# Patient Record
Sex: Male | Born: 1985 | Race: White | Hispanic: No | Marital: Single | Smoking: Never smoker
Health system: Southern US, Community
[De-identification: ages and names within clinical notes are randomized; demographics above are authoritative.]

---
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
## 2007-08-19 ENCOUNTER — Emergency Department: Payer: Self-pay | Admitting: Emergency Medicine

## 2008-09-03 ENCOUNTER — Emergency Department: Payer: Self-pay | Admitting: Emergency Medicine

## 2016-05-07 ENCOUNTER — Emergency Department
Admission: EM | Admit: 2016-05-07 | Discharge: 2016-05-07 | Disposition: A | Discharge status: Home / Self Care | Payer: Self-pay | Attending: Emergency Medicine | Admitting: Emergency Medicine

## 2016-05-07 DIAGNOSIS — K122 Cellulitis and abscess of mouth: Secondary | ICD-10-CM | POA: Insufficient documentation

## 2016-05-07 MED ORDER — IBUPROFEN 800 MG PO TABS
800.0000 mg | ORAL_TABLET | Freq: Once | ORAL | Status: AC
  Administered 2016-05-07: 800 mg via ORAL
  Filled 2016-05-07: qty 1

## 2016-05-07 MED ORDER — DEXAMETHASONE SODIUM PHOSPHATE 10 MG/ML IJ SOLN
10.0000 mg | Freq: Once | INTRAMUSCULAR | Status: AC
  Administered 2016-05-07: 10 mg via INTRAMUSCULAR
  Filled 2016-05-07: qty 1

## 2016-05-07 NOTE — Discharge Instructions (Signed)
Uvulitis °Uvulitis is infection or inflammation of the uvula. The uvula is the small, finger-like piece of tissue that hangs down at the back of your throat. °CAUSES °This condition may be caused by: °· An infection in the mouth or throat. This is the most common cause. °· Trauma to the uvula. Causes of trauma include burning your mouth and heavy snoring. °· Fluid build-up (edema). Edema can be triggered be an allergic reaction. Uvulitis that is caused by edema is called Quincke disease. °· Inhaling irritants, such as chemical agents, smoke, or steam. °SYMPTOMS °Symptoms of this condition depend on the cause.  °Symptoms of uvulitis that is caused by infection include: °· Red, swollen uvula. °· Sore throat. °· Fever. °· Headache. °· Swollen neck glands. °Symptoms of uvulitis that is caused by trauma, edema, or irritation include: °· Red, swollen uvula. °· Sore throat. °· Trouble swallowing. °· Choking or gagging. °· Trouble breathing. °DIAGNOSIS °This condition is diagnosed with a physical exam. You also may have tests, such as a throat culture and blood tests. °TREATMENT °Treatment for this condition depends on the cause. Treatment may involve: °· Antibiotic medicine. Antibiotics may be prescribed if a bacterial infection is the cause. °· Steroid medicine. Steroids may be given if edema is the cause. °· Surgery to remove part of the uvula (partial uvulectomy). °HOME CARE INSTRUCTIONS °· Rest as much as possible until your condition improves. °· Drink enough fluid to keep your urine clear or pale yellow. °· Take over-the-counter and prescription medicines only as told by your health care provider. °· If you were prescribed an antibiotic medicine, take it as told by your health care provider. Do not stop taking the antibiotic even if you start to feel better. °· Use a cool-mist humidifier to ease irritation in your throat. °· While your throat is sore: °¨ Eat soft foods or drink liquids, such as soup. °¨ Gargle with a  salt-water mixture 3-4 times per day or as needed. To make a salt-water mixture, completely dissolve ½-1 tsp of salt in 1 cup of warm water. °· Keep all follow-up visits as told by your health care provider. This is important. °SEEK MEDICAL CARE IF: °· You have a fever. °· You have trouble eating. °· Your symptoms do not get better. °· Your symptoms come back after treatment. °SEEK IMMEDIATE MEDICAL CARE IF: °· You have trouble breathing. °· You have trouble swallowing. °  °This information is not intended to replace advice given to you by your health care provider. Make sure you discuss any questions you have with your health care provider. °  °Document Released: 07/08/2004 Document Revised: 08/19/2015 Document Reviewed: 02/18/2015 °Elsevier Interactive Patient Education ©2016 [employer] ° °

## 2016-05-07 NOTE — ED Provider Notes (Signed)
[facility] Emergency Department Provider Note   ____________________________________________  Time seen: Approximately 6:14 AM  I have reviewed the triage vital signs and the nursing notes.   HISTORY  Chief Complaint Sore Throat    HPI Cole Weaver is a 30 y.o. male who comes into the hospital with some sore throat. The patient reports that he was sleeping and he woke up this morning. He felt as though his throat was dry and there was something in his throat. He felt that he needed to drink some water. He attempted to drink some water and spit up what was in his throat. He reports that he was unable to do so. When the patient went to look in his throat he noticed some swelling in the back of what he called his tonsils. The patient was concerned so he decided to come in for evaluation. The patient has no headache no chest pain no shortness of breath no fevers. The patient has not had any vomiting at home but did feel nauseous. The patient reports that he was drinking alcohol last night. He is here for evaluation.   No past medical history   There are no active problems to display for this patient.   No past surgical history  No current outpatient prescriptions  Allergies Review of patient's allergies indicates no known allergies.  No family history on file.  Social History Social History  Substance Use Topics  . Smoking status: None   . Smokeless tobacco: Not on file  . Alcohol Use: Drinks alcohol     Review of Systems Constitutional: No fever/chills Eyes: No visual changes. ENT:  sore throat. Cardiovascular: Denies chest pain. Respiratory: Denies shortness of breath. Gastrointestinal: Nausea and vomiting with No abdominal pain. No diarrhea.  No constipation. Genitourinary: Negative for dysuria. Musculoskeletal: Negative for back pain. Skin: Negative for rash. Neurological: Negative for headaches, focal weakness or  numbness.  10-point ROS otherwise negative.  ____________________________________________   PHYSICAL EXAM:  VITAL SIGNS: ED Triage Vitals  Enc Vitals Group     BP 05/07/16 0541 133/81 mmHg     Pulse Rate 05/07/16 0539 100     Resp 05/07/16 0539 20     Temp 05/07/16 0539 98.2 F (36.8 C)     Temp Source 05/07/16 0539 Oral     SpO2 05/07/16 0539 99 %     Weight 05/07/16 0538 289 lb (131.09 kg)     Height 05/07/16 0538  (1.854 m)     Head Cir --      Peak Flow --      Pain Score 05/07/16 0538 0     Pain Loc --      Pain Edu? --      Excl. in GC? --     Constitutional: Alert and oriented. Well appearing and in mild distress. Eyes: Conjunctivae are normal. PERRL. EOMI. Head: Atraumatic. Nose: No congestion/rhinnorhea. Mouth/Throat: Mucous membranes are moist.  Uvular edema with no unilateral peritonsillar or tonsillar swelling, minimal redness to the posterior oropharynx. ardiovascular: Normal rate, regular rhythm. Grossly normal heart sounds.  Good peripheral circulation. Respiratory: Normal respiratory effort.  No retractions. Lungs CTAB. Gastrointestinal: Soft and nontender. No distention.  Musculoskeletal: No lower extremity tenderness nor edema.   Neurologic:  Normal speech and language.  Skin:  Skin is warm, dry and intact.  Psychiatric: Mood and affect are normal. .  ____________________________________________   LABS (all labs ordered are listed, but only abnormal results are displayed)  Labs Reviewed  CULTURE, GROUP A STREP [facility])   ____________________________________________  EKG  None ____________________________________________  RADIOLOGY  None ____________________________________________   PROCEDURES  Procedure(s) performed: None  Critical Care performed: No  ____________________________________________   INITIAL IMPRESSION / ASSESSMENT AND PLAN / ED COURSE  Pertinent labs & imaging results that were available during my care of  the patient were reviewed by me and considered in my medical decision making (see chart for details).  This is a 30 year old male who comes into the hospital with some sore throat and throat discomfort. When I did evaluate the patient appears as though he has uvulitis with some uvular edema. The patient reports that this came after he sleep and woke back up. I did question the patient is snoring as a do no some of that trauma can cause some uvulitis symptoms. The patient is had no fevers or runny nose and he has no cervical lymphadenopathy. I will give the patient dose of Decadron and some ibuprofen. The patient is breathing without difficulty and his airways open. He'll be discharged home. As I feel this is not infectious I will not treat the patient with any antibiotics but he does have a throat culture is pending at this time and we will follow it and order antibiotics if needed. The patient be discharged to home. ____________________________________________   FINAL CLINICAL IMPRESSION(S) / ED DIAGNOSES  Final diagnoses:  Uvulitis      NEW MEDICATIONS STARTED DURING THIS VISIT:  New Prescriptions   No medications on file     Note:  This document was prepared using Dragon voice recognition software and may include unintentional dictation errors.    Rebecka ApleyAllison P Giavonni Cizek, MD 05/07/16 [phone]

## 2016-05-07 NOTE — ED Notes (Signed)

## 2016-05-07 NOTE — ED Notes (Addendum)
Patient reports that he woke and states that his tonsils are swollen.  Patient states feels like he can't swallow.  Patient is able to control his own secretions, and speaking in complete sentences.  Patients throat red, with uvula swollen, but airway is patent.

## 2016-05-09 LAB — CULTURE, GROUP A STREP (THRC)

## 2018-12-19 ENCOUNTER — Emergency Department
Admission: EM | Admit: 2018-12-19 | Discharge: 2018-12-19 | Disposition: A | Discharge status: Home / Self Care | Payer: BLUE CROSS/BLUE SHIELD | Attending: Emergency Medicine | Admitting: Emergency Medicine

## 2018-12-19 ENCOUNTER — Encounter: Payer: Self-pay | Admitting: Emergency Medicine

## 2018-12-19 ENCOUNTER — Other Ambulatory Visit: Payer: Self-pay

## 2018-12-19 ENCOUNTER — Emergency Department: Payer: BLUE CROSS/BLUE SHIELD

## 2018-12-19 DIAGNOSIS — M5412 Radiculopathy, cervical region: Secondary | ICD-10-CM | POA: Insufficient documentation

## 2018-12-19 LAB — CBC
HEMATOCRIT: 45.6 % (ref 39.0–52.0)
Hemoglobin: 15.4 g/dL (ref 13.0–17.0)
MCH: 29.1 pg (ref 26.0–34.0)
MCHC: 33.8 g/dL (ref 30.0–36.0)
MCV: 86.2 fL (ref 80.0–100.0)
PLATELETS: 232 10*3/uL (ref 150–400)
RBC: 5.29 MIL/uL (ref 4.22–5.81)
RDW: 12.5 % (ref 11.5–15.5)
WBC: 9 10*3/uL (ref 4.0–10.5)
nRBC: 0 % (ref 0.0–0.2)

## 2018-12-19 LAB — BASIC METABOLIC PANEL
Anion gap: 8 (ref 5–15)
BUN: 10 mg/dL (ref 6–20)
CHLORIDE: 105 mmol/L (ref 98–111)
CO2: 24 mmol/L (ref 22–32)
CREATININE: 1.07 mg/dL (ref 0.61–1.24)
Calcium: 8.9 mg/dL (ref 8.9–10.3)
GFR calc non Af Amer: >60 mL/min (ref >60)
GLUCOSE: 110 mg/dL — AB (ref 70–99)
Potassium: 3.9 mmol/L (ref 3.5–5.1)
Sodium: 137 mmol/L (ref 135–145)

## 2018-12-19 LAB — TROPONIN I: Troponin I: <0.03 ng/mL (ref <0.03)

## 2018-12-19 NOTE — Discharge Instructions (Signed)
Fortunately today your head CT and your CT of your neck are very reassuring.  Please follow up with a PMD within 1 week for a recheck and return to the ED sooner for any concerns.  It was a pleasure to take care of you today, and thank you for coming to our emergency department.  If you have any questions or concerns before leaving please ask the nurse to grab me and I'm more than happy to go through your aftercare instructions again.  If you have any concerns once you are home that you are not improving or are in fact getting worse before you can make it to your follow-up appointment, please do not hesitate to call [phone] and come back for further evaluation.  Merrily BrittleNeil Nilsa Macht, MD  Results for orders placed or performed during the hospital encounter of 12/19/18  Basic metabolic panel  Result Value Ref Range   Sodium 137 135 - 145 mmol/L   Potassium 3.9 3.5 - 5.1 mmol/L   Chloride 105 98 - 111 mmol/L   CO2 24 22 - 32 mmol/L   Glucose, Bld 110 (H) 70 - 99 mg/dL   BUN 10 6 - 20 mg/dL   Creatinine, Ser 1.611.07 0.61 - 1.24 mg/dL   Calcium 8.9 8.9 - 09.610.3 mg/dL   GFR calc non Af Amer >60 >60 mL/min   GFR calc Af Amer >60 >60 mL/min   Anion gap 8 5 - 15  CBC  Result Value Ref Range   WBC 9.0 4.0 - 10.5 K/uL   RBC 5.29 4.22 - 5.81 MIL/uL   Hemoglobin 15.4 13.0 - 17.0 g/dL   HCT 04.545.6 40.939.0 - 81.152.0 %   MCV 86.2 80.0 - 100.0 fL   MCH 29.1 26.0 - 34.0 pg   MCHC 33.8 30.0 - 36.0 g/dL   RDW 91.412.5 78.211.5 - 95.615.5 %   Platelets 232 150 - 400 K/uL   nRBC 0.0 0.0 - 0.2 %  Troponin I - ONCE - STAT  Result Value Ref Range   Troponin I <0.03 <0.03 ng/mL   Ct Head Wo Contrast  Result Date: 12/19/2018 CLINICAL DATA:  Radicular left arm pain roughly at C4-5. Tingling in back of head. EXAM: CT HEAD WITHOUT CONTRAST CT CERVICAL SPINE WITHOUT CONTRAST TECHNIQUE: Multidetector CT imaging of the head and cervical spine was performed following the standard protocol without intravenous contrast. Multiplanar CT image  reconstructions of the cervical spine were also generated. COMPARISON:  None. FINDINGS: CT HEAD FINDINGS Brain: No evidence of infarction, hemorrhage, hydrocephalus, extra-axial collection or mass lesion/mass effect. Vascular: No hyperdense vessel or unexpected calcification. Skull: Normal. Negative for fracture or focal lesion. Sinuses/Orbits: Negative. CT CERVICAL SPINE FINDINGS Alignment: Normal. Skull base and vertebrae: No acute fracture. No primary bone lesion or focal pathologic process. Soft tissues and spinal canal: No prevertebral fluid or swelling. No visible canal hematoma. Disc levels: Preserved disc heights. No visible cord or foraminal impingement. Upper chest: Negative. IMPRESSION: Negative head and cervical spine CT.  No visible impingement. Electronically Signed   By: Marnee SpringJonathon  Watts M.D.   On: 12/19/2018 04:41   Ct Cervical Spine Wo Contrast  Result Date: 12/19/2018 CLINICAL DATA:  Radicular left arm pain roughly at C4-5. Tingling in back of head. EXAM: CT HEAD WITHOUT CONTRAST CT CERVICAL SPINE WITHOUT CONTRAST TECHNIQUE: Multidetector CT imaging of the head and cervical spine was performed following the standard protocol without intravenous contrast. Multiplanar CT image reconstructions of the cervical spine were also generated. COMPARISON:  None.  FINDINGS: CT HEAD FINDINGS Brain: No evidence of infarction, hemorrhage, hydrocephalus, extra-axial collection or mass lesion/mass effect. Vascular: No hyperdense vessel or unexpected calcification. Skull: Normal. Negative for fracture or focal lesion. Sinuses/Orbits: Negative. CT CERVICAL SPINE FINDINGS Alignment: Normal. Skull base and vertebrae: No acute fracture. No primary bone lesion or focal pathologic process. Soft tissues and spinal canal: No prevertebral fluid or swelling. No visible canal hematoma. Disc levels: Preserved disc heights. No visible cord or foraminal impingement. Upper chest: Negative. IMPRESSION: Negative head and cervical  spine CT.  No visible impingement. Electronically Signed   By: Marnee Spring M.D.   On: 12/19/2018 04:41

## 2018-12-19 NOTE — ED Triage Notes (Signed)
Patient to ER for c/o tingling to left arm and to back of head. Patient denies any current headache or recent headache. Patient denies any unilateral weakness, denies any slurred speech, denies any sudden onset of symptoms. Patient states he has felt similar tingling in head last couple of weeks, but states arm tingling has gotten progressively worse throughout the day since this am. Denies any kind of strenuous activity recently.

## 2018-12-19 NOTE — ED Provider Notes (Signed)
[facility] Emergency Department Provider Note  ____________________________________________   First MD Initiated Contact with Patient 12/19/18 0402     (approximate)  I have reviewed the triage vital signs and the nursing notes.   HISTORY  Chief Complaint Tingling to left arm   HPI Cole Weaver is a 33 y.o. male who self presents to the emergency department with several days of tingling from the back of his neck down his left shoulder towards just anterior to his left elbow.  Atraumatic.  His symptoms became acutely worse this evening when he laid down to sleep which prompted the visit.  He does report intermittent weakness in his hands although not now.  He has no past medical history and takes no medications.  He came to the emergency department tonight because his symptoms acutely worsened and because he recently got health insurance so he figured he would finally get "checked out".  He denies dysuria frequency or hesitancy.  He denies chest pain shortness of breath abdominal pain nausea or vomiting.  He denies double vision or blurred vision.  No history of stroke.    History reviewed. No pertinent past medical history.  There are no active problems to display for this patient.   History reviewed. No pertinent surgical history.  Prior to Admission medications   Not on File    Allergies Patient has no known allergies.  No family history on file.  Social History Social History   Tobacco Use  . Smoking status: Never Smoker  . Smokeless tobacco: Never Used  Substance Use Topics  . Alcohol use: Not Currently  . Drug use: Not on file    Review of Systems Constitutional: No fever/chills Eyes: No visual changes. ENT: No sore throat. Cardiovascular: Denies chest pain. Respiratory: Denies shortness of breath. Gastrointestinal: No abdominal pain.  No nausea, no vomiting.  No diarrhea.  No constipation. Genitourinary: Negative for  dysuria. Musculoskeletal: Negative for back pain. Skin: Negative for rash. Neurological: Positive for focal tingling negative for headaches or seizures  ____________________________________________   PHYSICAL EXAM:  VITAL SIGNS: ED Triage Vitals  Enc Vitals Group     BP 12/19/18 0220 (!) 139/91     Pulse Rate 12/19/18 0220 62     Resp 12/19/18 0220 20     Temp 12/19/18 0220 98.2 F (36.8 C)     Temp Source 12/19/18 0220 Oral     SpO2 12/19/18 0220 97 %     Weight 12/19/18 0221 300 lb (136.1 kg)     Height 12/19/18 0221 6\' 1"  (1.854 m)     Head Circumference --      Peak Flow --      Pain Score 12/19/18 0221 0     Pain Loc --      Pain Edu? --      Excl. in GC? --     Constitutional: Alert and oriented x4 appears nervous although nontoxic no diaphoresis and speaks in full clear sentences Eyes: PERRL EOMI. Head: Atraumatic. Nose: No congestion/rhinnorhea. Mouth/Throat: No trismus Neck: No stridor.  No meningismus Cardiovascular: Normal rate, regular rhythm. Grossly normal heart sounds.  Good peripheral circulation. Respiratory: Normal respiratory effort.  No retractions. Lungs CTAB and moving good air Gastrointestinal: Soft nontender Musculoskeletal: No lower extremity edema   Neurologic:  Normal speech and language.  5 out of 5 grips biceps triceps hip flexion hip extension plantar flexion dorsiflexion no pronator drift Radicular tingling in roughly C4-C5 distribution on the left Skin:  Skin is warm, dry and intact. No rash noted. Psychiatric: Mood and affect are normal. Speech and behavior are normal.    ____________________________________________   DIFFERENTIAL includes but not limited to  Stroke, cervical radiculopathy, anxiety, metabolic derangement ____________________________________________   LABS (all labs ordered are listed, but only abnormal results are displayed)  Labs Reviewed  BASIC METABOLIC PANEL - Abnormal; Notable for the following  components:      Result Value   Glucose, Bld 110 (*)    All other components within normal limits  CBC  TROPONIN I    Lab work reviewed by me with no acute disease noted __________________________________________  EKG  ED ECG REPORT I, Merrily Brittle, the attending physician, personally viewed and interpreted this ECG.  Date: 12/19/2018 EKG Time:  Rate: 71 Rhythm: normal sinus rhythm QRS Axis: normal Intervals: normal ST/T Wave abnormalities: normal Narrative Interpretation: no evidence of acute ischemia  ____________________________________________  RADIOLOGY  CT scan of the head neck reviewed by me with no acute disease ____________________________________________   PROCEDURES  Procedure(s) performed: no  Procedures  Critical Care performed: no  ____________________________________________   INITIAL IMPRESSION / ASSESSMENT AND PLAN / ED COURSE  Pertinent labs & imaging results that were available during my care of the patient were reviewed by me and considered in my medical decision making (see chart for details).   As part of my medical decision making, I reviewed the following data within the electronic MEDICAL RECORD NUMBER History obtained from family if available, nursing notes, old chart and ekg, as well as notes from prior ED visits.  The patient comes to the emergency department with radicular sounding left arm tingling for the past several days.  No other neuro findings.  I discussed with the patient that I did not think he had a stroke and he more likely had cervical radiculopathy however given the unclear diagnosis we proceeded with a CT scan to evaluate for possible subacute stroke versus bony impingement.  Fortunately the CT scans are reassuring.  I have encouraged the patient to establish care with primary care to discuss possible MRI in the future.  He is discharged home with significant relief and verbally understands and agrees with the plan.       ____________________________________________   FINAL CLINICAL IMPRESSION(S) / ED DIAGNOSES  Final diagnoses:  Cervical radiculopathy      NEW MEDICATIONS STARTED DURING THIS VISIT:  There are no discharge medications for this patient.    Note:  This document was prepared using Dragon voice recognition software and may include unintentional dictation errors.    Merrily Brittle, MD 12/21/18 [phone]

## 2021-07-05 ENCOUNTER — Other Ambulatory Visit: Payer: Self-pay | Admitting: Infectious Diseases

## 2021-07-05 ENCOUNTER — Other Ambulatory Visit (HOSPITAL_COMMUNITY): Payer: Self-pay | Admitting: Infectious Diseases

## 2021-07-05 DIAGNOSIS — G44319 Acute post-traumatic headache, not intractable: Secondary | ICD-10-CM

## 2021-07-06 ENCOUNTER — Ambulatory Visit
Admission: RE | Admit: 2021-07-06 | Discharge: 2021-07-06 | Disposition: A | Discharge status: Home / Self Care | Payer: BC Managed Care – PPO | Source: Ambulatory Visit | Attending: Infectious Diseases | Admitting: Infectious Diseases

## 2021-07-06 ENCOUNTER — Other Ambulatory Visit: Payer: Self-pay

## 2021-07-06 DIAGNOSIS — G44319 Acute post-traumatic headache, not intractable: Secondary | ICD-10-CM | POA: Insufficient documentation

## 2022-03-04 ENCOUNTER — Other Ambulatory Visit (HOSPITAL_COMMUNITY): Payer: Self-pay | Admitting: Infectious Diseases

## 2022-03-04 ENCOUNTER — Other Ambulatory Visit: Payer: Self-pay | Admitting: Infectious Diseases

## 2022-03-04 DIAGNOSIS — N50812 Left testicular pain: Secondary | ICD-10-CM

## 2022-03-14 ENCOUNTER — Ambulatory Visit: Payer: Self-pay

## 2022-03-15 ENCOUNTER — Ambulatory Visit
Admission: RE | Admit: 2022-03-15 | Discharge: 2022-03-15 | Disposition: A | Discharge status: Home / Self Care | Payer: BLUE CROSS/BLUE SHIELD | Source: Ambulatory Visit | Attending: Infectious Diseases | Admitting: Infectious Diseases

## 2022-03-15 DIAGNOSIS — N50812 Left testicular pain: Secondary | ICD-10-CM | POA: Diagnosis not present

## 2022-04-12 ENCOUNTER — Other Ambulatory Visit: Payer: Self-pay | Admitting: *Deleted

## 2022-04-12 ENCOUNTER — Ambulatory Visit: Payer: BLUE CROSS/BLUE SHIELD | Admitting: Urology

## 2022-04-12 DIAGNOSIS — R300 Dysuria: Secondary | ICD-10-CM

## 2023-01-28 IMAGING — CT CT HEAD W/O CM
1 series · 16 of 30 positions shown, 20 images · non-contrast
Comparison: 12/19/2018

CLINICAL DATA: Bilateral temporal headache noted over the last 2
days. Light sensitivity. Symptoms began suddenly during lifting.

EXAM:
CT HEAD WITHOUT CONTRAST
TECHNIQUE: Contiguous axial images were obtained from the base of the skull
through the vertex without intravenous contrast.

[Series 2: head wo · axial · 0.49mm/px · z∈[-61,+79]mm · 16 of 32 slices shown, 20 images]
[im 2/32  brain]
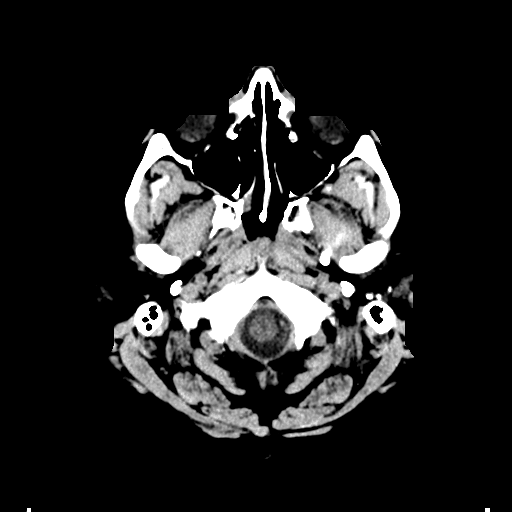
[im 2/32  bone]
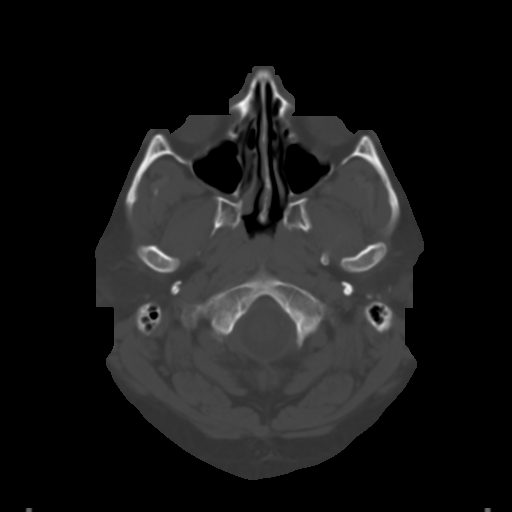
[im 4/32  brain]
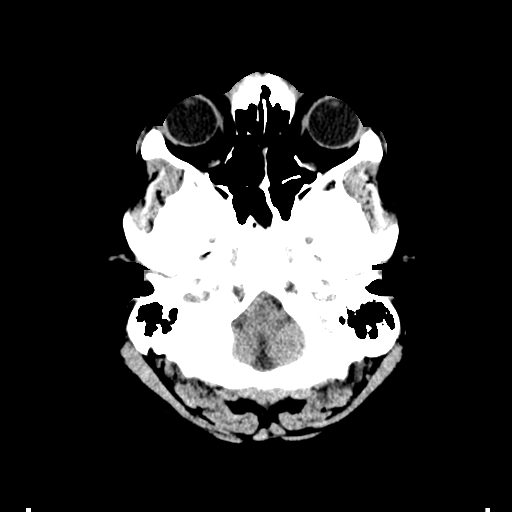
[im 6/32  brain]
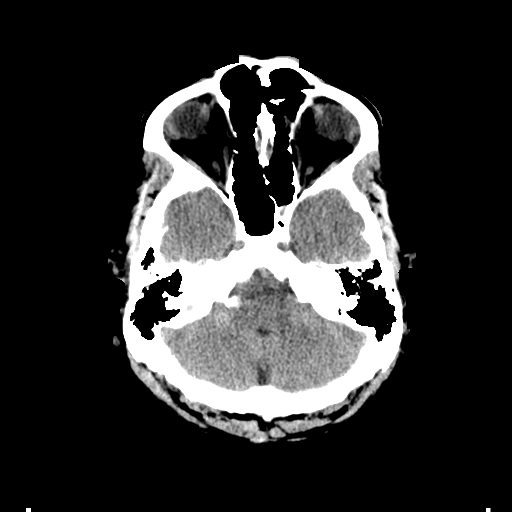
[im 8/32  brain]
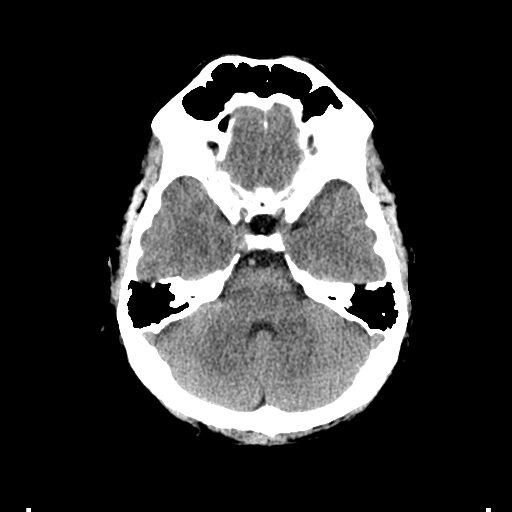
[im 9/32  brain]
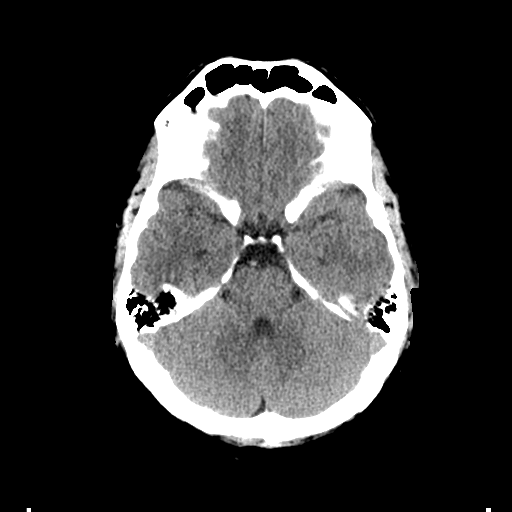
[im 9/32  bone]
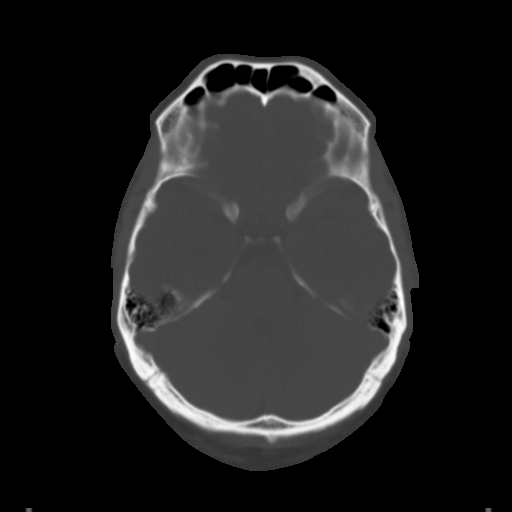
[im 11/32  brain]
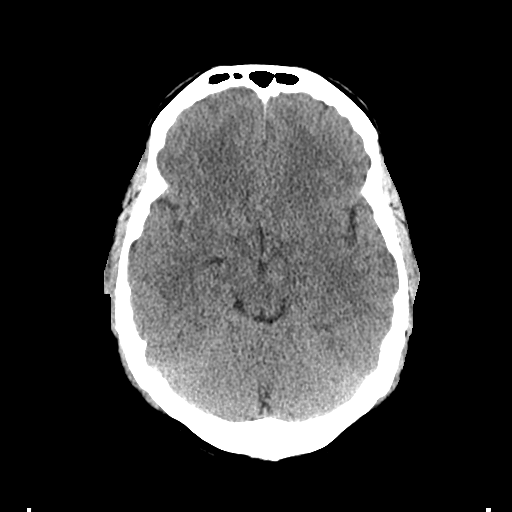
[im 13/32  brain]
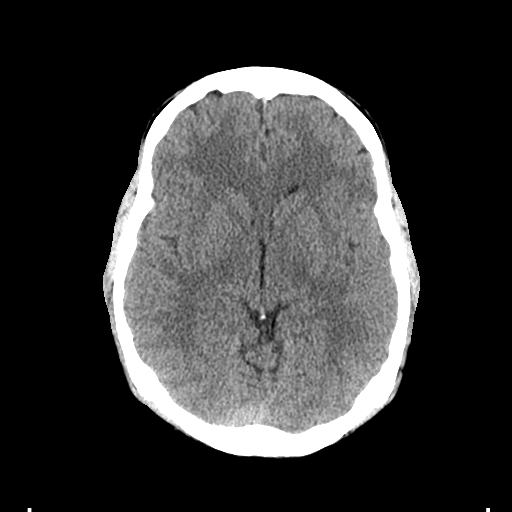
[im 15/32  brain]
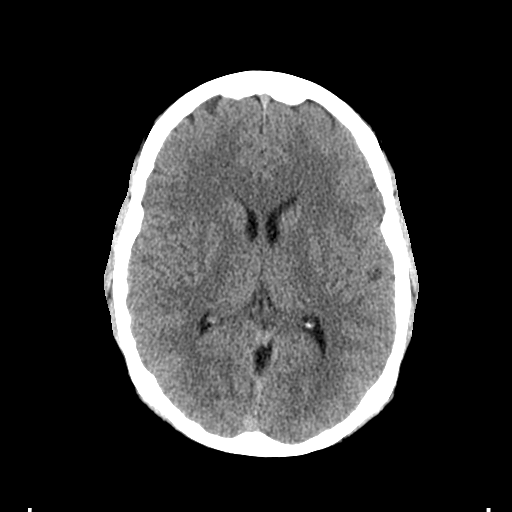
[im 17/32  brain]
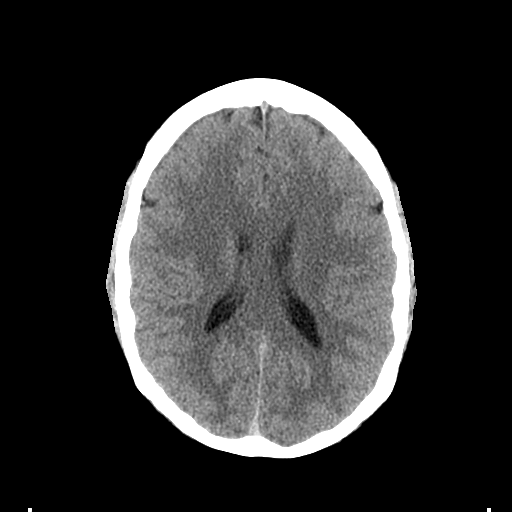
[im 17/32  bone]
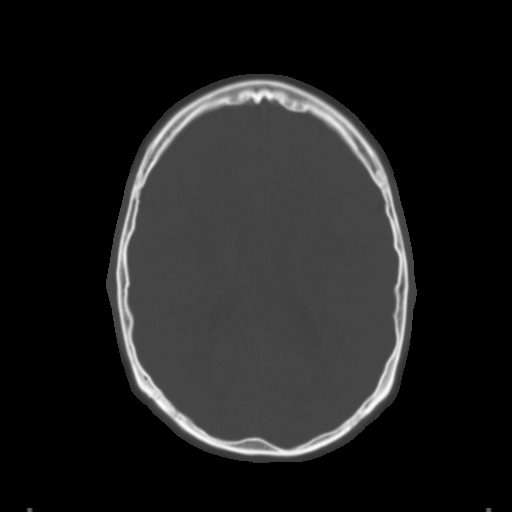
[im 19/32  brain]
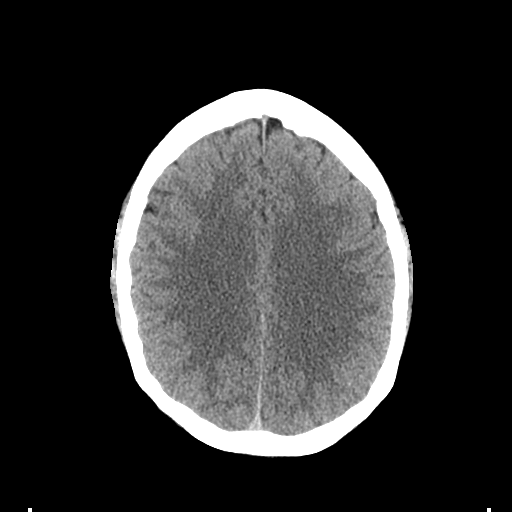
[im 21/32  brain]
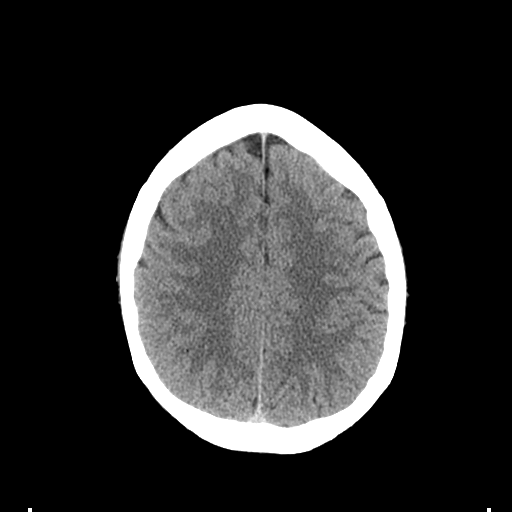
[im 23/32  brain]
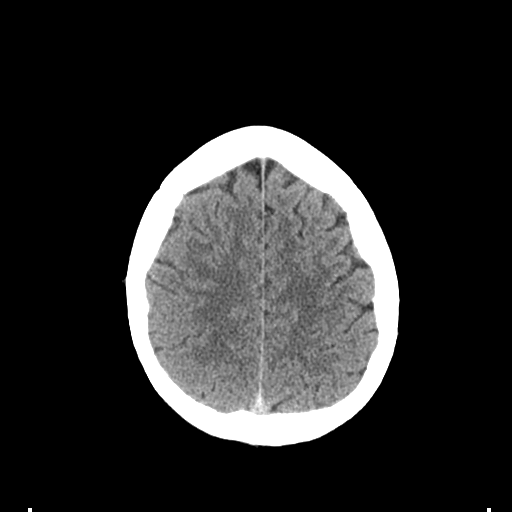
[im 24/32  brain]
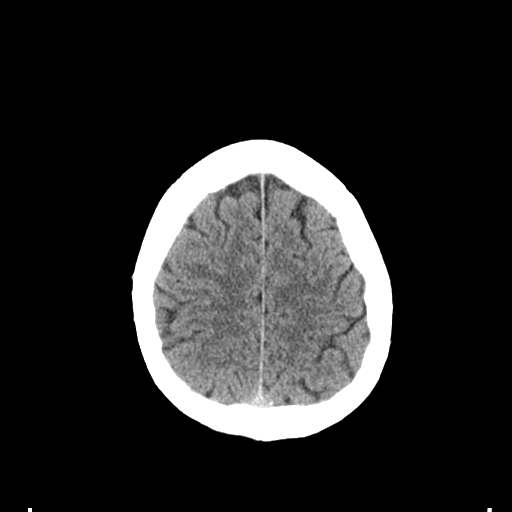
[im 24/32  bone]
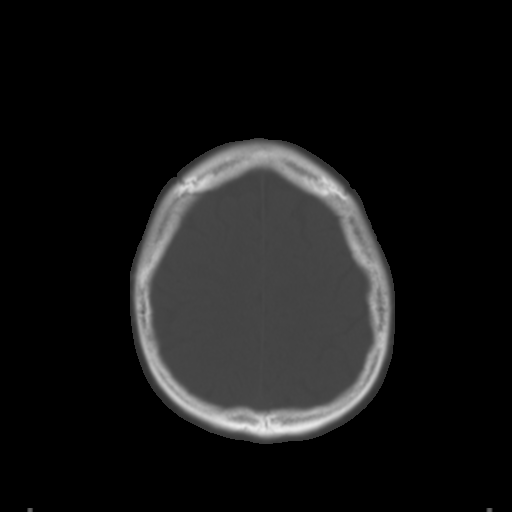
[im 26/32  brain]
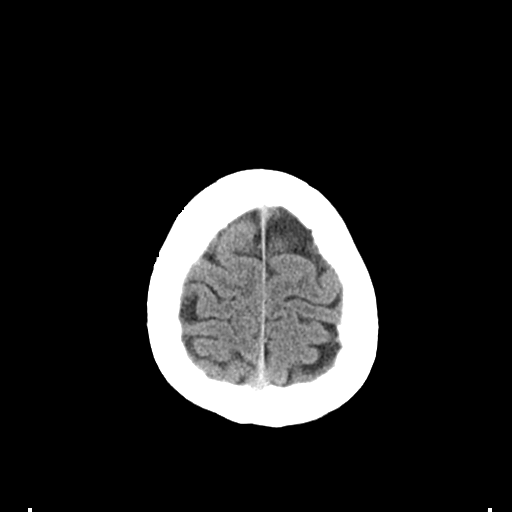
[im 28/32  brain]
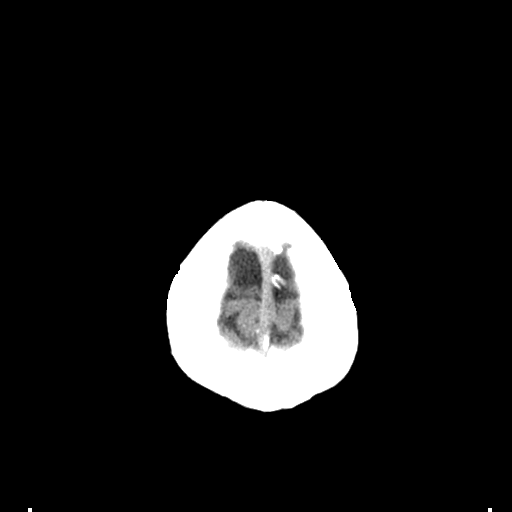
[im 30/32  brain]
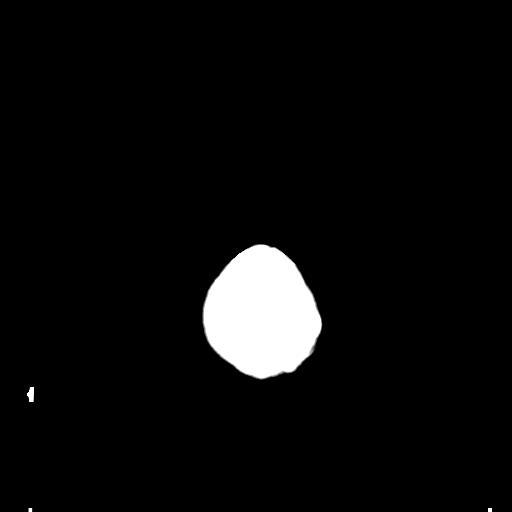

[16 of 30 positions shown; findings below may reference images not displayed]

FINDINGS: Brain: The brain shows a normal appearance without evidence of
malformation, atrophy, old or acute small or large vessel
infarction, mass lesion, hemorrhage, hydrocephalus or extra-axial
collection.

Vascular: No hyperdense vessel. No evidence of atherosclerotic
calcification.

Skull: Normal.  No traumatic finding.  No focal bone lesion.

Sinuses/Orbits: Sinuses are clear. Orbits appear normal. Mastoids
are clear.

Other: None significant
IMPRESSION: Normal head CT.

## 2023-07-04 ENCOUNTER — Other Ambulatory Visit: Payer: Self-pay

## 2023-07-04 ENCOUNTER — Emergency Department
Admission: EM | Admit: 2023-07-04 | Discharge: 2023-07-04 | Disposition: A | Discharge status: Home / Self Care | Payer: BLUE CROSS/BLUE SHIELD | Attending: Emergency Medicine | Admitting: Emergency Medicine

## 2023-07-04 DIAGNOSIS — R55 Syncope and collapse: Secondary | ICD-10-CM | POA: Insufficient documentation

## 2023-07-04 DIAGNOSIS — R110 Nausea: Secondary | ICD-10-CM | POA: Insufficient documentation

## 2023-07-04 LAB — COMPREHENSIVE METABOLIC PANEL
ALT: 53 U/L — ABNORMAL HIGH (ref 0–44)
AST: 30 U/L (ref 15–41)
Albumin: 3.9 g/dL (ref 3.5–5.0)
Alkaline Phosphatase: 65 U/L (ref 38–126)
Anion gap: 9 (ref 5–15)
BUN: 12 mg/dL (ref 6–20)
CO2: 21 mmol/L — ABNORMAL LOW (ref 22–32)
Calcium: 8.4 mg/dL — ABNORMAL LOW (ref 8.9–10.3)
Chloride: 103 mmol/L (ref 98–111)
Creatinine, Ser: 1.12 mg/dL (ref 0.61–1.24)
GFR, Estimated: >60 mL/min (ref >60)
Glucose, Bld: 133 mg/dL — ABNORMAL HIGH (ref 70–99)
Potassium: 3.4 mmol/L — ABNORMAL LOW (ref 3.5–5.1)
Sodium: 133 mmol/L — ABNORMAL LOW (ref 135–145)
Total Bilirubin: 0.7 mg/dL (ref 0.3–1.2)
Total Protein: 7.4 g/dL (ref 6.5–8.1)

## 2023-07-04 LAB — CBC
HCT: 46.5 % (ref 39.0–52.0)
Hemoglobin: 15.6 g/dL (ref 13.0–17.0)
MCH: 28.6 pg (ref 26.0–34.0)
MCHC: 33.5 g/dL (ref 30.0–36.0)
MCV: 85.2 fL (ref 80.0–100.0)
Platelets: 275 10*3/uL (ref 150–400)
RBC: 5.46 MIL/uL (ref 4.22–5.81)
RDW: 12.6 % (ref 11.5–15.5)
WBC: 12.9 10*3/uL — ABNORMAL HIGH (ref 4.0–10.5)
nRBC: 0 % (ref 0.0–0.2)

## 2023-07-04 NOTE — ED Triage Notes (Signed)
Pt was here at Dr. Jolyne Loa office getting labs, pt loss consciousness and bladder control. Pt then had another episode where his HR dropped to 30s. Pt alert and oriented on arrival to ED.

## 2023-07-04 NOTE — ED Triage Notes (Signed)
Pt here with LOC and nausea. Pt states he was getting blood drawn and looked down at the needle and passed out. Pt then became nauseous in triage. Pt states this sometimes happens when he gets his blood drawn.

## 2023-07-04 NOTE — Discharge Instructions (Signed)
Fortunately your EKG which monitors your heart rhythm and your blood tests were normal today.  Thank you for choosing [place] for your health care today!  Please see your primary doctor this week for a follow up appointment.   If you have any new, worsening, or unexpected symptoms call your doctor right away or come back to the emergency department for reevaluation.  It was my pleasure to care for you today.   Daneil Dan Modesto Charon, MD

## 2023-07-04 NOTE — ED Provider Notes (Signed)
[facility] Provider Note    Event Date/Time   First MD Initiated Contact with Patient 07/04/23 1119     (approximate)   History   Loss of Consciousness and Nausea   HPI  Cole Weaver is a 37 y.o. 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  He was at a routine physical and had blood drawn when they stuck his antecubital on the left and unable to find a vein, which caused a significant amount of pain. He had syncope 45 seconds no seizure activity reported back to baseline.  No trauma.    Otherwise in his regular state of health has been sleepy due to night work, no recent illnesses.  No history of syncope.  No chest pain palpitations or shortness of breath during this time.  Feels completely back to baseline now.  Some associated nausea at the time but none now.   Independent Historian contributed to assessment above: His mother is at bedside to corroborate information past medical history as above    Physical Exam   Triage Vital Signs: ED Triage Vitals [07/04/23 1058]  Encounter Vitals Group     BP 120/78     Systolic BP Percentile      Diastolic BP Percentile      Pulse Rate 70     Resp 18     Temp 97.9 F (36.6 C)     Temp Source Oral     SpO2 98 %     Weight (!) 300 lb 0.7 oz (136.1 kg)     Height 6\' 1"  (1.854 m)     Head Circumference      Peak Flow      Pain Score 0     Pain Loc      Pain Education      Exclude from Growth Chart     Most recent vital signs: Vitals:   07/04/23 1058  BP: 120/78  Pulse: 70  Resp: 18  Temp: 97.9 F (36.6 C)  SpO2: 98%    General: Awake, no distress.  CV:  Good peripheral perfusion.  Resp:  Normal effort.  Abd:  No distention.  Other:  Awake alert comfortable with normal vital signs, appears euvolemic.  Clear lungs soft nontender abdomen steady gait.   ED Results / Procedures / Treatments   Labs (all labs  ordered are listed, but only abnormal results are displayed) Labs Reviewed  CBC - Abnormal; Notable for the following components:      Result Value   WBC 12.9 (*)    All other components within normal limits  COMPREHENSIVE METABOLIC PANEL - Abnormal; Notable for the following components:   Sodium 133 (*)    Potassium 3.4 (*)    CO2 21 (*)    Glucose, Bld 133 (*)    Calcium 8.4 (*)    ALT 53 (*)    All other components within normal limits     I ordered and reviewed the above labs they are notable for is 12.9, potassium is just barely under normal at 3.4   EKG  ED ECG REPORT I, Pilar Jarvis, the attending physician, personally viewed and interpreted this ECG.   Date: 07/04/2023  EKG Time: 1054  Rate: 73  Rhythm: sinus w pvc  Axis: nl  Intervals:none  ST&T Change: no stemi   PROCEDURES:  Critical Care performed: No  Procedures  MEDICATIONS ORDERED IN ED: Medications - No data to display  IMPRESSION / MDM / ASSESSMENT AND PLAN / ED COURSE  I reviewed the triage vital signs and the nursing notes.                                Patient's presentation is most consistent with acute presentation with potential threat to life or bodily function.  Differential diagnosis includes, but is not limited to, vasovagal syncope, cardiogenic syncope like arrhythmia, electrolyte disturbance   The patient is on the cardiac monitor to evaluate for evidence of arrhythmia and/or significant heart rate changes.  MDM: Patient with most likely vasovagal syncope completely resolved and asymptomatic now.  This was in the setting of getting his blood drawn painful stimulus.  No other acute medical complaints, no recent illnesses and basic blood test and EKG unremarkable except for some PVCs.  Plan is for discharge with close PMD follow-up return with any worsening.       FINAL CLINICAL IMPRESSION(S) / ED DIAGNOSES   Final diagnoses:  Vasovagal syncope     Rx / DC Orders   ED  Discharge Orders     None        Note:  This document was prepared using Dragon voice recognition software and may include unintentional dictation errors.    Pilar Jarvis, MD 07/04/23 [phone]

## 2023-10-07 IMAGING — US US SCROTUM W/ DOPPLER COMPLETE
1 series · 14 of 25 positions shown · non-contrast
Comparison: None.

CLINICAL DATA: Left testicular pain

EXAM:
SCROTAL ULTRASOUND
DOPPLER ULTRASOUND OF THE TESTICLES
TECHNIQUE: Complete ultrasound examination of the testicles, epididymis, and
other scrotal structures was performed. Color and spectral Doppler
ultrasound were also utilized to evaluate blood flow to the
testicles.

[Series 1: us scrotum w/doppler · 52 acquisitions, 14 frames shown]
[im 1/52]
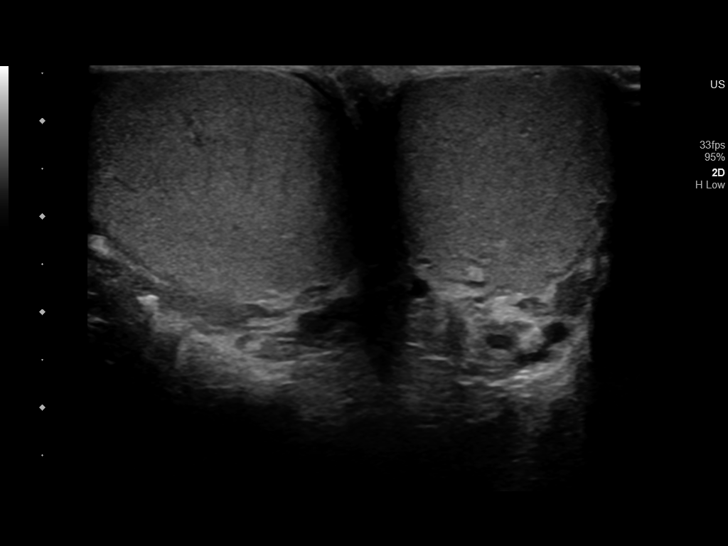
[im 5/52]
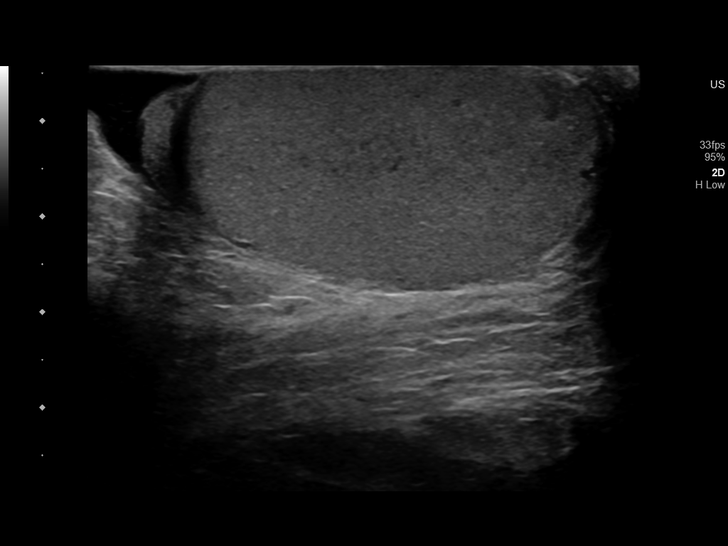
[im 9/52]
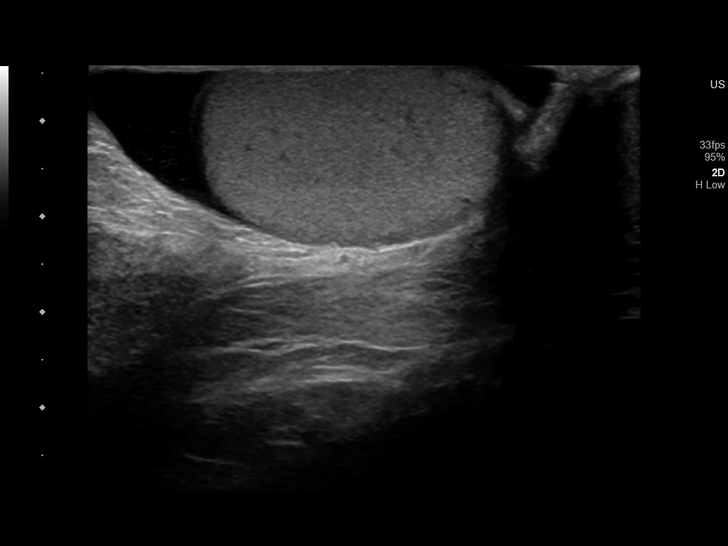
[im 13/52]
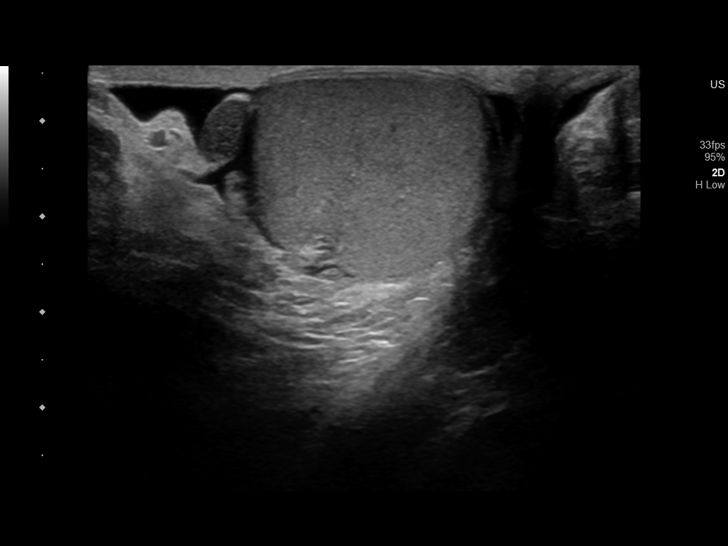
[im 18/52]
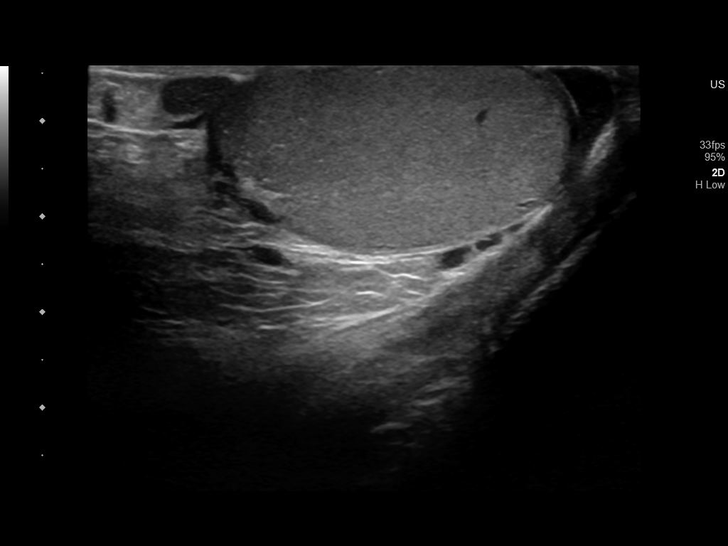
[im 20/52]
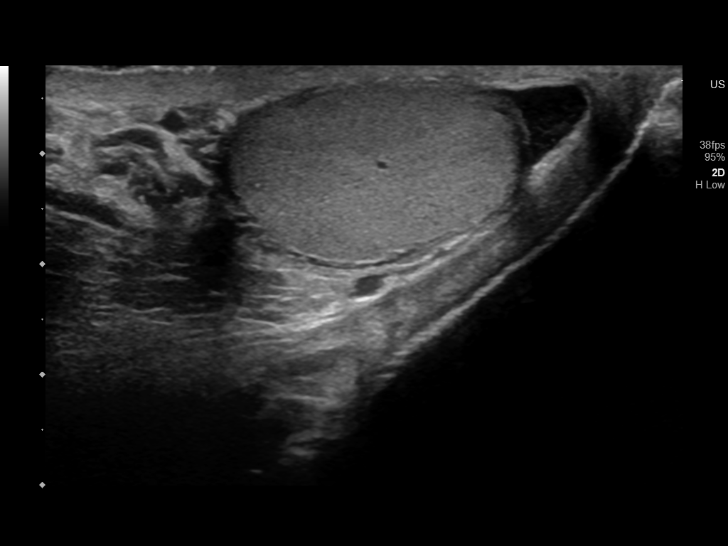
[im 24/52]
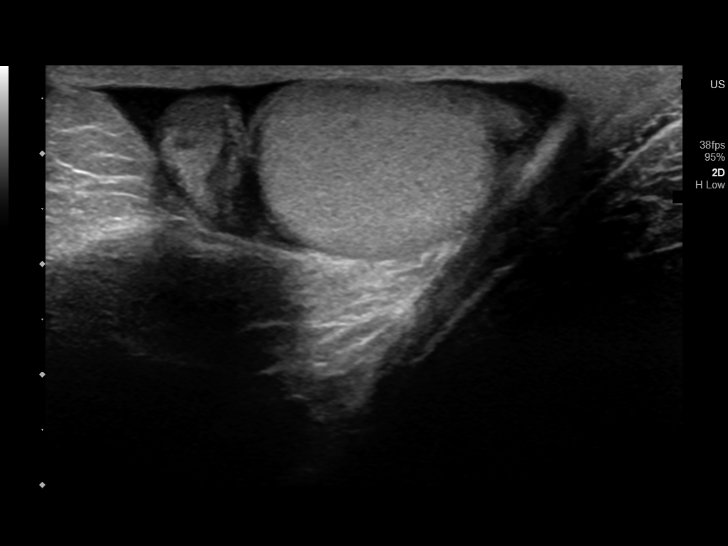
[im 28/52]
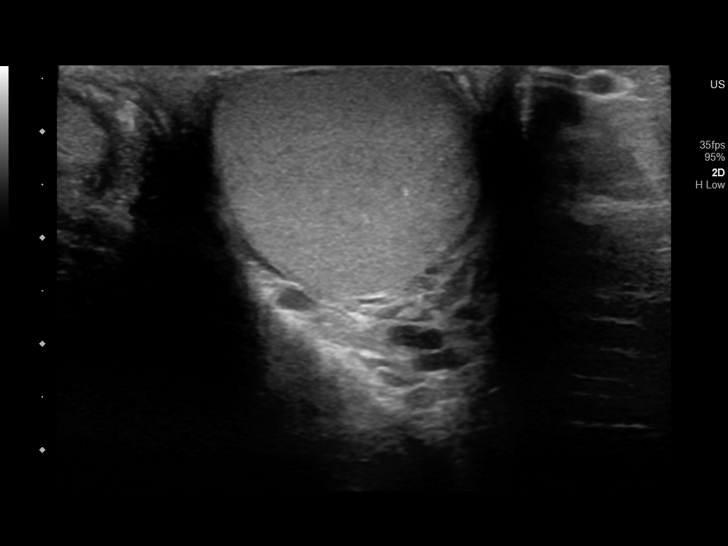
[im 32/52]
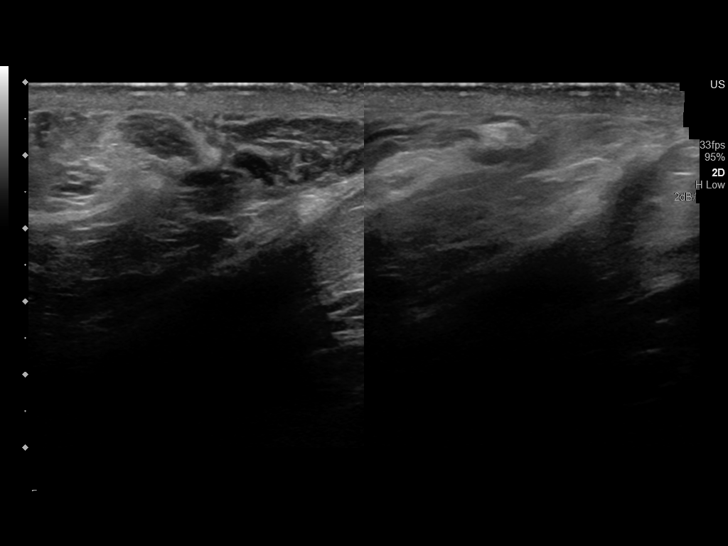
[im 35/52]
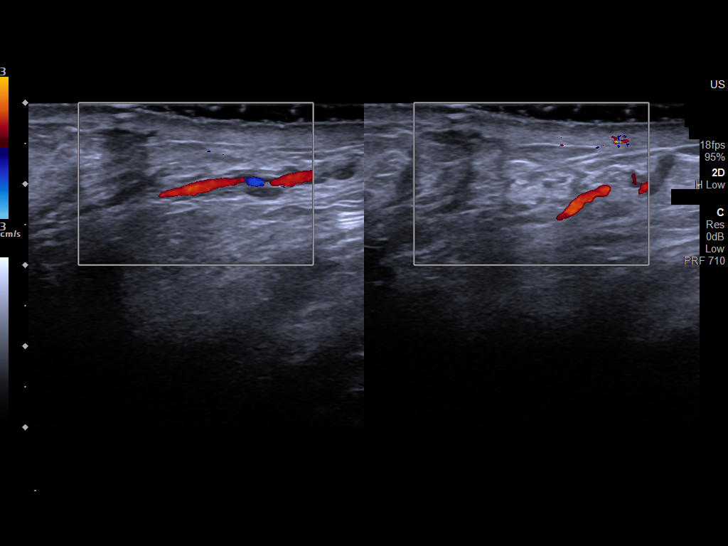
[im 39/52]
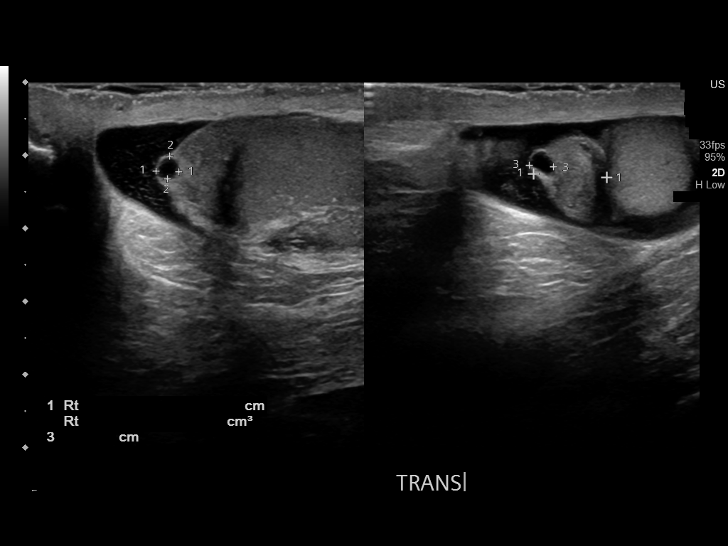
[im 43/52]
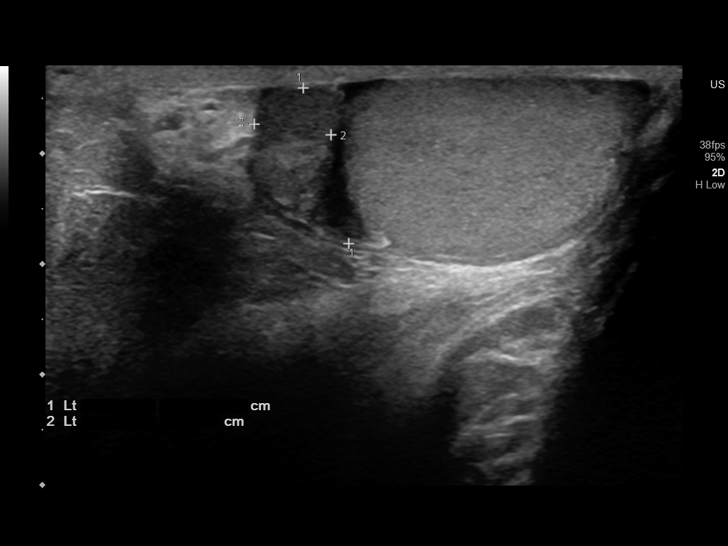
[im 47/52]
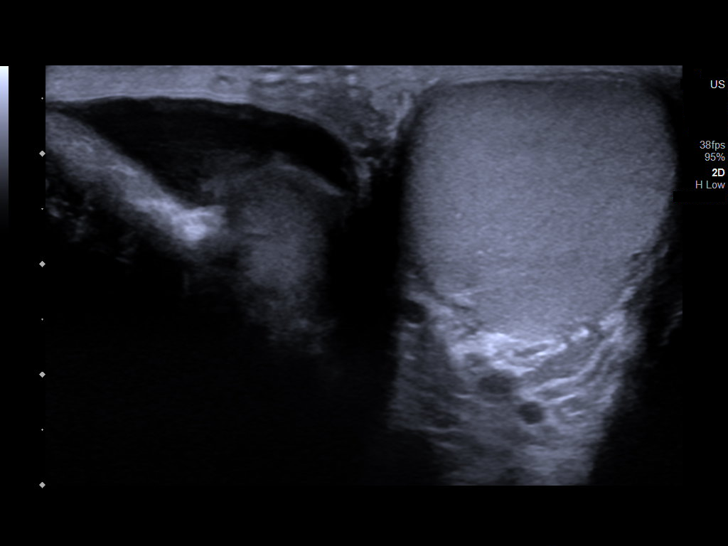
[im 52/52]
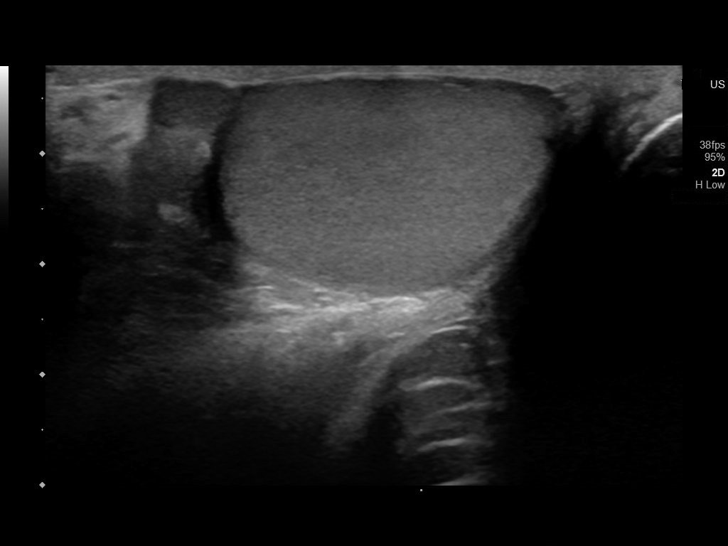

[14 of 25 positions shown; findings below may reference images not displayed]

FINDINGS: Right testicle

Measurements: 3.9 x 2.6 x 2.8 cm. No mass or microlithiasis
visualized.

Left testicle

Measurements: 3.9 x 2 x 2.6 cm. No mass or microlithiasis
visualized.

Right epididymis:  There is 3 mm cyst in the right epididymis

Left epididymis:  Normal in size and appearance.

Hydrocele: Small bilateral hydrocele is noted. Minimal amount of
debris is seen in the hydrocele.

Varicocele:  None visualized.

Pulsed Doppler interrogation of both testes demonstrates normal low
resistance arterial and venous waveforms bilaterally.
IMPRESSION: There is no evidence of testicular torsion. There is homogeneous
echogenicity in both testes.

3 mm right epididymal cyst.  Small bilateral hydrocele.

## 2024-12-06 ENCOUNTER — Encounter: Payer: Self-pay | Admitting: Emergency Medicine

## 2024-12-06 ENCOUNTER — Ambulatory Visit
Admission: EM | Admit: 2024-12-06 | Discharge: 2024-12-06 | Disposition: A | Discharge status: Home / Self Care | Attending: Nurse Practitioner | Admitting: Nurse Practitioner

## 2024-12-06 ENCOUNTER — Other Ambulatory Visit: Payer: Self-pay

## 2024-12-06 ENCOUNTER — Emergency Department
Admission: EM | Admit: 2024-12-06 | Discharge: 2024-12-06 | Disposition: A | Discharge status: Home / Self Care | Attending: Emergency Medicine | Admitting: Emergency Medicine

## 2024-12-06 ENCOUNTER — Emergency Department

## 2024-12-06 DIAGNOSIS — R079 Chest pain, unspecified: Secondary | ICD-10-CM | POA: Insufficient documentation

## 2024-12-06 LAB — BASIC METABOLIC PANEL WITH GFR
Anion gap: 10 (ref 5–15)
BUN: 10 mg/dL (ref 6–20)
CO2: 26 mmol/L (ref 22–32)
Calcium: 8.9 mg/dL (ref 8.9–10.3)
Chloride: 101 mmol/L (ref 98–111)
Creatinine, Ser: 0.92 mg/dL (ref 0.61–1.24)
GFR, Estimated: >60 mL/min
Glucose, Bld: 108 mg/dL — ABNORMAL HIGH (ref 70–99)
Potassium: 4 mmol/L (ref 3.5–5.1)
Sodium: 137 mmol/L (ref 135–145)

## 2024-12-06 LAB — CBC
HCT: 46.1 % (ref 39.0–52.0)
Hemoglobin: 15.7 g/dL (ref 13.0–17.0)
MCH: 29.3 pg (ref 26.0–34.0)
MCHC: 34.1 g/dL (ref 30.0–36.0)
MCV: 86 fL (ref 80.0–100.0)
Platelets: 224 K/uL (ref 150–400)
RBC: 5.36 MIL/uL (ref 4.22–5.81)
RDW: 12.9 % (ref 11.5–15.5)
WBC: 9.2 K/uL (ref 4.0–10.5)
nRBC: 0 % (ref 0.0–0.2)

## 2024-12-06 LAB — TROPONIN T, HIGH SENSITIVITY
Troponin T High Sensitivity: <15 ng/L (ref 0–19)
Troponin T High Sensitivity: <15 ng/L (ref 0–19)

## 2024-12-06 MED ORDER — LIDOCAINE VISCOUS HCL 2 % MT SOLN
15.0000 mL | Freq: Once | OROMUCOSAL | Status: AC
  Administered 2024-12-06: 15 mL via ORAL
  Filled 2024-12-06: qty 15

## 2024-12-06 MED ORDER — PANTOPRAZOLE SODIUM 20 MG PO TBEC: 20.0000 mg | DELAYED_RELEASE_TABLET | Freq: Every day | ORAL | 1 refills | Status: AC

## 2024-12-06 MED ORDER — ALUM & MAG HYDROXIDE-SIMETH 200-200-20 MG/5ML PO SUSP
30.0000 mL | Freq: Once | ORAL | Status: AC
  Administered 2024-12-06: 30 mL via ORAL
  Filled 2024-12-06: qty 30

## 2024-12-06 NOTE — ED Triage Notes (Signed)
 Pt c/o non radiating midsternal chest pain started last night, but today the pain radiates to left shoulder. Pt denies N/V, SOB

## 2024-12-06 NOTE — ED Triage Notes (Signed)
 Pt to ED via POV. Pt states that he started having chest pain last night around 2200. Pt states that when he woke up this morning he was still having chest pain and started having pain in his left arm. Pt denies N/V or shob. Pt has hx/o high cholesterol and family hx.

## 2024-12-06 NOTE — Discharge Instructions (Addendum)
 Please go to the emergency department [facility] to be evaluated for your chest pain.  Please go and have

## 2024-12-06 NOTE — ED Provider Notes (Signed)
 "  [facility] Provider Note    Event Date/Time   First MD Initiated Contact with Patient 12/06/24 2120     (approximate)   History   Chest Pain   HPI  Cole Weaver is a 38 y.o. male who presents to the emergency department today because of concerns for chest pain.  Pain started yesterday evening shortly after eating.  Located in the center part of his chest.  Today however the pain started going into his left arm.  He denies any associated shortness of breath.  No nausea or vomiting.  Patient denies similar pain in the past.  He did not notice anything that made the pain better or worse today.      Physical Exam   Triage Vital Signs: ED Triage Vitals  Encounter Vitals Group     BP 12/06/24 1900 (!) 154/101     Girls Systolic BP Percentile --      Girls Diastolic BP Percentile --      Boys Systolic BP Percentile --      Boys Diastolic BP Percentile --      Pulse Rate 12/06/24 1900 74     Resp 12/06/24 1900 16     Temp 12/06/24 1900 97.7 F (36.5 C)     Temp Source 12/06/24 1900 Oral     SpO2 12/06/24 1900 98 %     Weight 12/06/24 1858 (!) 300 lb 0.7 oz (136.1 kg)     Height 12/06/24 1858 6' 1 (1.854 m)     Head Circumference --      Peak Flow --      Pain Score 12/06/24 1858 4     Pain Loc --      Pain Education --      Exclude from Growth Chart --     Most recent vital signs: Vitals:   12/06/24 1900  BP: (!) 154/101  Pulse: 74  Resp: 16  Temp: 97.7 F (36.5 C)  SpO2: 98%   General: Awake, alert, oriented. CV:  Good peripheral perfusion. Regular rate and rhythm. Resp:  Normal effort. Lungs clear. Abd:  No distention.    ED Results / Procedures / Treatments   Labs (all labs ordered are listed, but only abnormal results are displayed) Labs Reviewed  BASIC METABOLIC PANEL WITH GFR - Abnormal; Notable for the following components:      Result Value   Glucose, Bld 108 (*)    All other components within normal limits  CBC   TROPONIN T, HIGH SENSITIVITY  TROPONIN T, HIGH SENSITIVITY     EKG  I, Guadalupe Eagles, attending physician, personally viewed and interpreted this EKG  EKG Time: 1730 Rate: 71 Rhythm: normal sinus rhythm Axis: normal Intervals: qtc 419 QRS: narrow ST changes: no st elevation Impression: normal ekg    RADIOLOGY I independently interpreted and visualized the CXR. My interpretation: No pneumonia Radiology interpretation:  IMPRESSION:  1. No acute cardiopulmonary pathology.      PROCEDURES:  Critical Care performed: No   MEDICATIONS ORDERED IN ED: Medications - No data to display   IMPRESSION / MDM / ASSESSMENT AND PLAN / ED COURSE  I reviewed the triage vital signs and the nursing notes.                              Differential diagnosis includes, but is not limited to, ACS, esophagitis, pneumonia, pneumothorax  Patient's presentation is  most consistent with acute presentation with potential threat to life or bodily function.   Patient presented to the emergency department today because of concerns for chest pain that started yesterday evening.  On exam patient without any abnormal auscultatory findings.  EKG and troponins negative.  Chest x-ray without concerning pneumonia or pneumothorax.  I have low concern for PE given description and quality of pain, lack of hypoxia tachypnea or tachycardia.  Do think esophagitis and gastritis likely.  Discussed this with the patient.  Will plan on discharging with antacid.  Encourage patient to return for any new or worsening symptoms.   FINAL CLINICAL IMPRESSION(S) / ED DIAGNOSES   Final diagnoses:  Nonspecific chest pain        Rx / DC Orders   ED Discharge Orders     None        Note:  This document was prepared using Dragon voice recognition software and may include unintentional dictation errors.    Floy Roberts, MD 12/06/24 [phone]  "

## 2024-12-06 NOTE — ED Provider Notes (Signed)
 " [facility]    CSN: [id number] Arrival date & time: 12/06/24  1635      History   Chief Complaint No chief complaint on file.   HPI Cole Weaver is a 38 y.o. male.   HPI  80 old male presents for evaluation of substernal chest pain that radiates to the left shoulder that began last night at about 9:30.  He denies any shortness of breath, sweating, nausea, sour taste in his mouth, or burning in his esophagus.  History reviewed. No pertinent past medical history.  There are no active problems to display for this patient.   History reviewed. No pertinent surgical history.     Home Medications    Prior to Admission medications  Not on File    Family History History reviewed. No pertinent family history.  Social History Social History[1]   Allergies   Patient has no known allergies.   Review of Systems Review of Systems  Constitutional:  Negative for diaphoresis.  Respiratory:  Negative for cough, shortness of breath and wheezing.   Cardiovascular:  Positive for chest pain.     Physical Exam Triage Vital Signs ED Triage Vitals  Encounter Vitals Group     BP 12/06/24 1724 (!) 128/91     Girls Systolic BP Percentile --      Girls Diastolic BP Percentile --      Boys Systolic BP Percentile --      Boys Diastolic BP Percentile --      Pulse Rate 12/06/24 1724 85     Resp 12/06/24 1724 20     Temp 12/06/24 1724 98 F (36.7 C)     Temp Source 12/06/24 1724 Oral     SpO2 12/06/24 1724 100 %     Weight --      Height --      Head Circumference --      Peak Flow --      Pain Score 12/06/24 1723 4     Pain Loc --      Pain Education --      Exclude from Growth Chart --    No data found.  Updated Vital Signs BP (!) 128/91   Pulse 85   Temp 98 F (36.7 C) (Oral)   Resp 20   SpO2 100%   Visual Acuity Right Eye Distance:   Left Eye Distance:   Bilateral Distance:    Right Eye Near:   Left Eye Near:    Bilateral Near:      Physical Exam Vitals and nursing note reviewed.  Constitutional:      Appearance: Normal appearance. He is not ill-appearing.  HENT:     Head: Normocephalic and atraumatic.  Cardiovascular:     Rate and Rhythm: Normal rate and regular rhythm.     Pulses: Normal pulses.     Heart sounds: Normal heart sounds. No murmur heard.    No friction rub. No gallop.  Pulmonary:     Effort: Pulmonary effort is normal.     Breath sounds: Normal breath sounds. No wheezing, rhonchi or rales.  Chest:     Chest wall: No tenderness.  Skin:    General: Skin is warm and dry.     Capillary Refill: Capillary refill takes less than 2 seconds.     Findings: No rash.  Neurological:     General: No focal deficit present.     Mental Status: He is alert and oriented to person, place, and  time.      UC Treatments / Results  Labs (all labs ordered are listed, but only abnormal results are displayed) Labs Reviewed - No data to display  EKG Normal sinus rhythm with a ventricular to 71 bpm PR interval 188 ms QRS duration 94 ms QT/QTc 386/490 ms No ST or T wave abnormalities noted.  Radiology No results found.  Procedures Procedures (including critical care time)  Medications Ordered in UC Medications - No data to display  Initial Impression / Assessment and Plan / UC Course  I have reviewed the triage vital signs and the nursing notes.  Pertinent labs & imaging results that were available during my care of the patient were reviewed by me and considered in my medical decision making (see chart for details).   Patient presents for evaluation of chest pain as outlined in HPI above.  The pain is in the middle of his chest and radiates to his left shoulder.  No provoking or alleviating symptoms.  No associated diaphoresis, shortness of breath, nausea, vomiting, burning in the throat or esophagus, or sour taste in the mouth.  He reports that last night he thought it might be heartburn but typically  it goes away and does not last as long as it does here.  His EKG shows normal sinus rhythm without any ST or T wave abnormalities.  I have advised the patient that even though his EKG is normal this could still be his heart and that he needs to be evaluated in the emergency department.  He is elected to go to [facility].  He left ambulatory in stable condition.   Final Clinical Impressions(s) / UC Diagnoses   Final diagnoses:  Chest pain, unspecified type     Discharge Instructions      Please go to the emergency department [facility] to be evaluated for your chest pain.  Please go and have     ED Prescriptions   None    PDMP not reviewed this encounter.     [1]  Social History Tobacco Use   Smoking status: Never   Smokeless tobacco: Never  Vaping Use   Vaping status: Never Used  Substance Use Topics   Alcohol use: Not Currently   Drug use: Never     Bernardino Ditch, NP 12/06/24 1824  "
